# Patient Record
Sex: Female | Born: 1995 | Race: Black or African American | Hispanic: No | Marital: Single | Smoking: Never smoker
Health system: Southern US, Community
[De-identification: ages and names within clinical notes are randomized; demographics above are authoritative.]

## PROBLEM LIST (undated history)

## (undated) DIAGNOSIS — E119 Type 2 diabetes mellitus without complications: Secondary | ICD-10-CM

---

## 2021-01-21 ENCOUNTER — Other Ambulatory Visit: Payer: Self-pay

## 2021-01-21 ENCOUNTER — Encounter (HOSPITAL_COMMUNITY): Payer: Self-pay

## 2021-01-21 ENCOUNTER — Emergency Department (HOSPITAL_COMMUNITY): Payer: BC Managed Care – PPO

## 2021-01-21 ENCOUNTER — Emergency Department (HOSPITAL_COMMUNITY)
Admission: EM | Admit: 2021-01-21 | Discharge: 2021-01-21 | Disposition: A | Discharge status: Home / Self Care | Payer: BC Managed Care – PPO | Attending: Emergency Medicine | Admitting: Emergency Medicine

## 2021-01-21 DIAGNOSIS — R000 Tachycardia, unspecified: Secondary | ICD-10-CM | POA: Diagnosis not present

## 2021-01-21 DIAGNOSIS — R739 Hyperglycemia, unspecified: Secondary | ICD-10-CM

## 2021-01-21 DIAGNOSIS — E1165 Type 2 diabetes mellitus with hyperglycemia: Secondary | ICD-10-CM | POA: Insufficient documentation

## 2021-01-21 DIAGNOSIS — Z3A01 Less than 8 weeks gestation of pregnancy: Secondary | ICD-10-CM | POA: Insufficient documentation

## 2021-01-21 DIAGNOSIS — B9689 Other specified bacterial agents as the cause of diseases classified elsewhere: Secondary | ICD-10-CM | POA: Diagnosis not present

## 2021-01-21 DIAGNOSIS — O26891 Other specified pregnancy related conditions, first trimester: Secondary | ICD-10-CM | POA: Diagnosis present

## 2021-01-21 DIAGNOSIS — O23591 Infection of other part of genital tract in pregnancy, first trimester: Secondary | ICD-10-CM | POA: Insufficient documentation

## 2021-01-21 DIAGNOSIS — R109 Unspecified abdominal pain: Secondary | ICD-10-CM

## 2021-01-21 HISTORY — DX: Type 2 diabetes mellitus without complications: E11.9

## 2021-01-21 LAB — CBC WITH DIFFERENTIAL/PLATELET
Abs Immature Granulocytes: 0.02 10*3/uL (ref 0.00–0.07)
Basophils Absolute: 0 10*3/uL (ref 0.0–0.1)
Basophils Relative: 0 %
Eosinophils Absolute: 0 10*3/uL (ref 0.0–0.5)
Eosinophils Relative: 1 %
HCT: 41 % (ref 36.0–46.0)
Hemoglobin: 13 g/dL (ref 12.0–15.0)
Immature Granulocytes: 0 %
Lymphocytes Relative: 28 %
Lymphs Abs: 2.2 10*3/uL (ref 0.7–4.0)
MCH: 22 pg — ABNORMAL LOW (ref 26.0–34.0)
MCHC: 31.7 g/dL (ref 30.0–36.0)
MCV: 69.4 fL — ABNORMAL LOW (ref 80.0–100.0)
Monocytes Absolute: 0.7 10*3/uL (ref 0.1–1.0)
Monocytes Relative: 9 %
Neutro Abs: 5 10*3/uL (ref 1.7–7.7)
Neutrophils Relative %: 62 %
Platelets: 356 10*3/uL (ref 150–400)
RBC: 5.91 MIL/uL — ABNORMAL HIGH (ref 3.87–5.11)
RDW: 17.6 % — ABNORMAL HIGH (ref 11.5–15.5)
WBC: 8 10*3/uL (ref 4.0–10.5)
nRBC: 0 % (ref 0.0–0.2)

## 2021-01-21 LAB — URINALYSIS, ROUTINE W REFLEX MICROSCOPIC
Bilirubin Urine: NEGATIVE
Glucose, UA: >=500 mg/dL — AB
Hgb urine dipstick: NEGATIVE
Ketones, ur: 80 mg/dL — AB
Leukocytes,Ua: NEGATIVE
Nitrite: NEGATIVE
Protein, ur: NEGATIVE mg/dL
Specific Gravity, Urine: 1.044 — ABNORMAL HIGH (ref 1.005–1.030)
pH: 6 (ref 5.0–8.0)

## 2021-01-21 LAB — COMPREHENSIVE METABOLIC PANEL
ALT: 10 U/L (ref 0–44)
AST: 19 U/L (ref 15–41)
Albumin: 4.1 g/dL (ref 3.5–5.0)
Alkaline Phosphatase: 53 U/L (ref 38–126)
Anion gap: 7 (ref 5–15)
BUN: 7 mg/dL (ref 6–20)
CO2: 23 mmol/L (ref 22–32)
Calcium: 9.3 mg/dL (ref 8.9–10.3)
Chloride: 104 mmol/L (ref 98–111)
Creatinine, Ser: 0.53 mg/dL (ref 0.44–1.00)
GFR, Estimated: >60 mL/min (ref >60)
Glucose, Bld: 290 mg/dL — ABNORMAL HIGH (ref 70–99)
Potassium: 3.7 mmol/L (ref 3.5–5.1)
Sodium: 134 mmol/L — ABNORMAL LOW (ref 135–145)
Total Bilirubin: 0.4 mg/dL (ref 0.3–1.2)
Total Protein: 8.3 g/dL — ABNORMAL HIGH (ref 6.5–8.1)

## 2021-01-21 LAB — ABO/RH: ABO/RH(D): AB POS

## 2021-01-21 LAB — TYPE AND SCREEN
ABO/RH(D): AB POS
Antibody Screen: NEGATIVE

## 2021-01-21 LAB — WET PREP, GENITAL
Sperm: NONE SEEN
Trich, Wet Prep: NONE SEEN
Yeast Wet Prep HPF POC: NONE SEEN

## 2021-01-21 LAB — RH IG WORKUP (INCLUDES ABO/RH): Gestational Age(Wks): 5

## 2021-01-21 LAB — I-STAT BETA HCG BLOOD, ED (MC, WL, AP ONLY): I-stat hCG, quantitative: 1198.4 m[IU]/mL — ABNORMAL HIGH (ref <5)

## 2021-01-21 LAB — HCG, QUANTITATIVE, PREGNANCY: hCG, Beta Chain, Quant, S: 1321 m[IU]/mL — ABNORMAL HIGH (ref <5)

## 2021-01-21 MED ORDER — CLINDAMYCIN PHOSPHATE 100 MG VA SUPP: 100.0000 mg | Freq: Every day | VAGINAL | 0 refills | Status: AC

## 2021-01-21 MED ORDER — SODIUM CHLORIDE 0.9 % IV BOLUS
1000.0000 mL | Freq: Once | INTRAVENOUS | Status: AC
Start: 2021-01-21 — End: 2021-01-21
  Administered 2021-01-21: 1000 mL via INTRAVENOUS

## 2021-01-21 MED ORDER — ONDANSETRON HCL 4 MG/2ML IJ SOLN
4.0000 mg | Freq: Once | INTRAMUSCULAR | Status: AC
  Administered 2021-01-21: 4 mg via INTRAVENOUS
  Filled 2021-01-21: qty 2

## 2021-01-21 MED ORDER — SODIUM CHLORIDE 0.9 % IV BOLUS
1000.0000 mL | Freq: Once | INTRAVENOUS | Status: AC
  Administered 2021-01-21: 1000 mL via INTRAVENOUS

## 2021-01-21 MED ORDER — MORPHINE SULFATE (PF) 4 MG/ML IV SOLN
4.0000 mg | Freq: Once | INTRAVENOUS | Status: AC
  Administered 2021-01-21: 4 mg via INTRAVENOUS
  Filled 2021-01-21: qty 1

## 2021-01-21 NOTE — ED Notes (Signed)
Pt verbalized understanding of d/c, medication, and follow up care. Ambulatory with steady gait.  

## 2021-01-21 NOTE — ED Provider Notes (Signed)
[facility]-EMERGENCY DEPT Provider Note   CSN: [id number] Arrival date & time: 01/21/21  1818     History Chief Complaint  Patient presents with  . Abdominal Pain    Samantha Wade is a 25 y.o. female with history significant for type 2 diabetes who presents for evaluation abdominal pain.  Began yesterday.  Pain intermittent in nature. Currently located at RLQ however will go to multiple spots in the abdomen.  Pain is not worse with movement.  Can happen spontaneously.  Can last for minutes to hours.  Has noted a large amount of dark pink vaginal discharge.  LMP 12/15/2020.  She is not currently followed by OB/GYN however does have an appointment scheduled to see [facility] obgyn on 02/01/21.  She has not had ultrasound establish IUP.  This is her first pregnancy.  She rates her pain a 7/10.  No fever, chills, chest pain, shortness of breath, dysuria, hematuria.  Denies additional aggravating or alleviating factors.  No prior abdominal surgeries.  History obtained from patient and past medical records.  No interpreter used  HPI     Past Medical History:  Diagnosis Date  . Diabetes mellitus without complication (HCC)     There are no problems to display for this patient.   History reviewed. No pertinent surgical history.   OB History    Gravida  1   Para      Term      Preterm      AB      Living        SAB      IAB      Ectopic      Multiple      Live Births              No family history on file.  Social History   Tobacco Use  . Smoking status: Never Smoker  . Smokeless tobacco: Never Used  Substance Use Topics  . Alcohol use: Not Currently  . Drug use: Not Currently    Home Medications Prior to Admission medications   Medication Sig Start Date End Date Taking? Authorizing Provider  clindamycin (CLEOCIN) 100 MG vaginal suppository Place 1 suppository (100 mg total) vaginally at bedtime for 7 days. 01/21/21 01/28/21 Yes  Chidi Shirer A, PA-C  Prenatal Vit-Fe Fumarate-FA (MULTIVITAMIN-PRENATAL) 27-0.8 MG TABS tablet Take 1 tablet by mouth daily at 12 noon.   Yes [provider]    Allergies    Patient has no known allergies.  Review of Systems   Review of Systems  Constitutional: Negative.   HENT: Negative.   Respiratory: Negative.   Cardiovascular: Negative.   Gastrointestinal: Positive for abdominal pain and nausea. Negative for diarrhea and vomiting.  Genitourinary: Positive for vaginal discharge.  Musculoskeletal: Negative.   Skin: Negative.   Neurological: Negative.   All other systems reviewed and are negative.   Physical Exam Updated Vital Signs BP 132/72   Pulse (!) 103   Temp 98.8 F (37.1 C) (Oral) Comment: Simultaneous filing. User may not have seen previous data. Comment (Src): Simultaneous filing. User may not have seen previous data.  Resp 16   Ht 5\' 2"  (1.575 m)   Wt 95.3 kg   LMP 12/15/2020   SpO2 100%   BMI 38.41 kg/m   Physical Exam Vitals and nursing note reviewed.  Constitutional:      General: She is not in acute distress.    Appearance: She is well-developed. She is  not ill-appearing, toxic-appearing or diaphoretic.  HENT:     Head: Normocephalic and atraumatic.     Mouth/Throat:     Mouth: Mucous membranes are moist.  Eyes:     Pupils: Pupils are equal, round, and reactive to light.  Cardiovascular:     Rate and Rhythm: Tachycardia present.     Heart sounds: Normal heart sounds.  Pulmonary:     Effort: Pulmonary effort is normal. No respiratory distress.     Breath sounds: Normal breath sounds.  Abdominal:     General: Bowel sounds are normal. There is no distension.     Palpations: Abdomen is soft.     Tenderness: There is abdominal tenderness in the right lower quadrant. There is no guarding or rebound.     Hernia: No hernia is present.     Comments: Tenderness to right lower quadrant.  Genitourinary:    Comments: Normal appearing  external female genitalia without rashes or lesions, normal vaginal epithelium. Normal appearing closed cervix with large amount thick white discharge. No cervical petechiae. Cervical os is closed. There is no bleeding noted at the os. No odor. Bimanual: No CMT, nontender.  No palpable adnexal masses or tenderness. Uterus midline and not fixed. Rectovaginal exam was deferred.  No cystocele or rectocele noted. No pelvic lymphadenopathy noted. Wet prep was obtained.  Cultures for gonorrhea and chlamydia collected. Exam performed with chaperone in room. Musculoskeletal:        General: Normal range of motion.     Cervical back: Normal range of motion.  Skin:    General: Skin is warm and dry.     Capillary Refill: Capillary refill takes less than 2 seconds.  Neurological:     General: No focal deficit present.     Mental Status: She is alert.     ED Results / Procedures / Treatments   Labs (all labs ordered are listed, but only abnormal results are displayed) Labs Reviewed  WET PREP, GENITAL - Abnormal; Notable for the following components:      Result Value   Clue Cells Wet Prep HPF POC PRESENT (*)    WBC, Wet Prep HPF POC FEW (*)    All other components within normal limits  CBC WITH DIFFERENTIAL/PLATELET - Abnormal; Notable for the following components:   RBC 5.91 (*)    MCV 69.4 (*)    MCH 22.0 (*)    RDW 17.6 (*)    All other components within normal limits  COMPREHENSIVE METABOLIC PANEL - Abnormal; Notable for the following components:   Sodium 134 (*)    Glucose, Bld 290 (*)    Total Protein 8.3 (*)    All other components within normal limits  URINALYSIS, ROUTINE W REFLEX MICROSCOPIC - Abnormal; Notable for the following components:   APPearance CLOUDY (*)    Specific Gravity, Urine 1.044 (*)    Glucose, UA >=500 (*)    Ketones, ur 80 (*)    Bacteria, UA RARE (*)    All other components within normal limits  HCG, QUANTITATIVE, PREGNANCY - Abnormal; Notable for the  following components:   hCG, Beta Chain, Quant, S 1,321 (*)    All other components within normal limits  I-STAT BETA HCG BLOOD, ED ([facility], [facility], [facility] ONLY) - Abnormal; Notable for the following components:   I-stat hCG, quantitative 1,198.4 (*)    All other components within normal limits  RPR  CBG MONITORING, ED  RH IG WORKUP (INCLUDES ABO/RH)  TYPE AND SCREEN  ABO/RH  GC/CHLAMYDIA PROBE AMP ([place]    EKG EKG Interpretation  Date/Time:  Thursday Jan 21 2021 20:04:48 EDT Ventricular Rate:  104 PR Interval:    QRS Duration: 96 QT Interval:  339 QTC Calculation: 446 R Axis:   -18 Text Interpretation: Probable left ventricular hypertrophy sinus tachycardia, no previous for comparison Confirmed by Frederick PeersLittle, Rachel [id number]) on 01/21/2021 8:53:40 PM   Radiology US OB Comp < 14 Wks  Result Date: 01/21/2021 CLINICAL DATA:  Pregnancy bleeding pain EXAM: OBSTETRIC <14 WK US AND TRANSVAGINAL OB US TECHNIQUE: Both transabdominal and transvaginal ultrasound examinations were performed for complete evaluation of the gestation as well as the maternal uterus, adnexal regions, and pelvic cul-de-sac. Transvaginal technique was performed to assess early pregnancy. COMPARISON:  None. FINDINGS: Intrauterine gestational sac: Possible tiny intrauterine gestational sac Yolk sac:  Not seen Embryo:  Not seen MSD: 2.8 mm   5 w   0 d Maternal uterus/adnexae: Ovaries are within normal limits. The left ovary measures 3.7 x 2.8 x 2.3 cm and contains corpus luteum. The right ovary measures 2.4 x 1.6 x 2.5 cm. Trace free fluid. IMPRESSION: 1. Possible early intrauterine gestational sac, but no yolk sac, fetal pole, or cardiac activity yet visualized. Recommend follow-up quantitative B-HCG levels and follow-up US in 14 days to confirm and assess viability. This recommendation follows SRU consensus guidelines: Diagnostic Criteria for Nonviable Pregnancy Early in the First Trimester. Malva Limes Engl J Med 2013;  604:5409-81;  369:1443-51. 2. Trace free fluid Electronically Signed   By: Jasmine PangKim  Fujinaga M.D.   On: 01/21/2021 20:40   US OB Transvaginal  Result Date: 01/21/2021 CLINICAL DATA:  Pregnancy bleeding pain EXAM: OBSTETRIC <14 WK US AND TRANSVAGINAL OB US TECHNIQUE: Both transabdominal and transvaginal ultrasound examinations were performed for complete evaluation of the gestation as well as the maternal uterus, adnexal regions, and pelvic cul-de-sac. Transvaginal technique was performed to assess early pregnancy. COMPARISON:  None. FINDINGS: Intrauterine gestational sac: Possible tiny intrauterine gestational sac Yolk sac:  Not seen Embryo:  Not seen MSD: 2.8 mm   5 w   0 d Maternal uterus/adnexae: Ovaries are within normal limits. The left ovary measures 3.7 x 2.8 x 2.3 cm and contains corpus luteum. The right ovary measures 2.4 x 1.6 x 2.5 cm. Trace free fluid. IMPRESSION: 1. Possible early intrauterine gestational sac, but no yolk sac, fetal pole, or cardiac activity yet visualized. Recommend follow-up quantitative B-HCG levels and follow-up US in 14 days to confirm and assess viability. This recommendation follows SRU consensus guidelines: Diagnostic Criteria for Nonviable Pregnancy Early in the First Trimester. Malva Limes Engl J Med 2013; 191:4782-95; 369:1443-51. 2. Trace free fluid Electronically Signed   By: Jasmine PangKim  Fujinaga M.D.   On: 01/21/2021 20:40    Procedures Procedures   Medications Ordered in ED Medications  sodium chloride 0.9 % bolus 1,000 mL (1,000 mLs Intravenous Bolus from Bag 01/21/21 1936)  morphine 4 MG/ML injection 4 mg (4 mg Intravenous Given 01/21/21 1928)  ondansetron (ZOFRAN) injection 4 mg (4 mg Intravenous Given 01/21/21 1928)  sodium chloride 0.9 % bolus 1,000 mL (0 mLs Intravenous Stopped 01/21/21 2106)    ED Course  I have reviewed the triage vital signs and the nursing notes.  Pertinent labs & imaging results that were available during my care of the patient were reviewed by me and considered in my  medical decision making (see chart for details).  G1, P0 approximately [redacted] weeks pregnant based off LMP presents for evaluation of  abd pain.  Began yesterday, intermittent in nature. Associated dark pink vaginal discharge.  Has not had ultrasound to establish IUP.  She is afebrile, nonseptic appearing.  She does appear uncomfortable in room.  No urinary complaints.  Patient is tachycardic however not hypotensive.  Plan on labs, imaging and reassess.  Labs and imaging personally reviewed and interpreted:  I-STAT preg at 1198 UA negative for infection, does have glucosuria, ketonuria Metabolic panel with sodium 134, glucose 290, normal CO2, no elevated anion gap.  Low suspicion for DKA Wet prep with many clue cells consistent with BV Blood type Rh+ Ultrasound with possible IUP however no yolk sac, heartbeat  Patient reassessed.  Tachycardia improved with IV fluids.  No current pain.  Reevaluation abdomen soft, nontender.  Discussed ultrasound and labs.  Will treat for BV.  She is already on prenatal vitamin.  Discussed need for close outpatient follow-up with OB.  She will call them tomorrow.  On repeat exam patient does not have a surgical abdomin and there are no peritoneal signs.  No indication of appendicitis, bowel obstruction, bowel perforation, cholecystitis, diverticulitis, PID, TOA, torsion or ectopic pregnancy.    The patient has been appropriately medically screened and/or stabilized in the ED. I have low suspicion for any other emergent medical condition which would require further screening, evaluation or treatment in the ED or require inpatient management.  Patient is hemodynamically stable and in no acute distress.  Patient able to ambulate in department prior to ED.  Evaluation does not show acute pathology that would require ongoing or additional emergent interventions while in the emergency department or further inpatient treatment.  I have discussed the diagnosis with the patient  and answered all questions.  Pain is been managed while in the emergency department and patient has no further complaints prior to discharge.  Patient is comfortable with plan discussed in room and is stable for discharge at this time.  I have discussed strict return precautions for returning to the emergency department.  Patient was encouraged to follow-up with PCP/specialist refer to at discharge.      MDM Rules/Calculators/A&P                           Final Clinical Impression(s) / ED Diagnoses Final diagnoses:  Bacterial vaginosis in pregnancy  Hyperglycemia  Abdominal pain during pregnancy in first trimester    Rx / DC Orders ED Discharge Orders         Ordered    clindamycin (CLEOCIN) 100 MG vaginal suppository  Daily at bedtime        01/21/21 2141           Xan Ingraham A, PA-C 01/21/21 2146    Little, Ambrose Finland, MD 01/28/21 1338

## 2021-01-21 NOTE — Discharge Instructions (Signed)
Call your OB/GYN tomorrow and let them know you are seen here in the emergency department.  Your vaginal swab did show bacterial vaginosis which is an overgrowth of the normal bacteria that lives in the vagina.  I have written you for a vaginal suppository to use nightly over the next 7 nights.  Return for new or worsening symptoms

## 2021-01-21 NOTE — ED Triage Notes (Signed)
Pt c/o lower abd pain/cramping that radiates to back with "large amounts of pinkish discharge" since yesterday. Pt states she is [redacted] weeks pregnant.

## 2021-01-22 LAB — GC/CHLAMYDIA PROBE AMP (~~LOC~~) NOT AT ARMC
Chlamydia: NEGATIVE
Comment: NEGATIVE
Comment: NORMAL
Neisseria Gonorrhea: NEGATIVE

## 2021-01-22 LAB — RPR
RPR Ser Ql: REACTIVE — AB
RPR Titer: 1:16 {titer}

## 2021-01-25 LAB — T.PALLIDUM AB, TOTAL: T Pallidum Abs: REACTIVE — AB

## 2021-02-10 ENCOUNTER — Other Ambulatory Visit: Payer: Self-pay

## 2021-02-10 ENCOUNTER — Ambulatory Visit (INDEPENDENT_AMBULATORY_CARE_PROVIDER_SITE_OTHER): Payer: Medicaid Other | Admitting: Clinical

## 2021-02-10 DIAGNOSIS — F603 Borderline personality disorder: Secondary | ICD-10-CM

## 2021-02-10 DIAGNOSIS — F39 Unspecified mood [affective] disorder: Secondary | ICD-10-CM | POA: Diagnosis not present
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
## 2021-02-22 ENCOUNTER — Other Ambulatory Visit: Payer: Self-pay

## 2021-02-22 ENCOUNTER — Ambulatory Visit (INDEPENDENT_AMBULATORY_CARE_PROVIDER_SITE_OTHER): Payer: BC Managed Care – PPO | Admitting: Clinical

## 2021-02-22 DIAGNOSIS — F39 Unspecified mood [affective] disorder: Secondary | ICD-10-CM | POA: Diagnosis not present

## 2021-02-22 DIAGNOSIS — F603 Borderline personality disorder: Secondary | ICD-10-CM | POA: Diagnosis not present

## 2021-02-22 NOTE — Progress Notes (Signed)
   THERAPIST PROGRESS NOTE  Session Time: 1pm  Participation Level: Active  Behavioral Response: CasualAlertAnxious  Type of Therapy: Individual Therapy  Treatment Goals addressed: Coping  Interventions: DBT  Summary: Samantha Wade is a 25 y.o. female who presents in anxious manner. Pt reports recently having a miscarriage.Pt declined participation in MHIOP at this time due to experiencing social anxiety. Pt reports avoiding social settings and avoids making eye contact with others in public settings. Pt reports an ongoing hx of unhealthy relationships and challenges with making and maintaining relationships with others. Pt says she only interacts with her mother but communicates with people on social media. Pt report when they do something to "disrespect her" she will block them or stop communicating with them for a period of time. Pt acknowledges this has made a lonely life and states "I want to be around people, I dont want to die alone like my daddy" Pt endorses being very short tempered, hypervigilant, aggressive. Pt showed Clinical research associate where she has done property damage to her home when she has gotten upset, displaying several holes in the walls. Additionally, pt says she will cut self if she is not able to physically harm the person who has made her upset. Pt discussed having similar challenges in college with maintaining relationships. At that time, pt says she was promiscuous, engaging in substance use and having stressful relationships with her roommates. In school, pt says due to so many conflicts/complaints she was assigned single room each year. Presently, pt states having conflict with her roommate whom she lives with.  Suicidal/Homicidal: Pt denies any current SI/HI, has hx of self harming behavior and physical aggression when she feels she is being "disrespected" Pt reports she was hospitalized 3x in college for SI, no attempts made. Pt reports she would text her friends and state "Im  going to kill myself" and would campus police would arrive would minimize or deny SI.  Therapist Response: CSW assessed for changes in mood and behavior. CSW introduced emotion regulation skills to assist pt with learning to manage feelings and better cope with stressful situation.CSW discussed with pt practicing opposite action to change emotion. CSW processed with pt cognitive distortions and pt tendency to jump to conclusions when she suspects she is being disrespected or upset.  Plan: Return again in 2 weeks.  Diagnosis: Axis I: Unspecified mood disorder    Axis II: Borderline Personality Dis.    Suzan Slick, LCSW 02/22/2021

## 2021-03-11 ENCOUNTER — Other Ambulatory Visit: Payer: Self-pay

## 2021-03-11 ENCOUNTER — Ambulatory Visit (INDEPENDENT_AMBULATORY_CARE_PROVIDER_SITE_OTHER): Payer: BC Managed Care – PPO | Admitting: Clinical

## 2021-03-11 DIAGNOSIS — F603 Borderline personality disorder: Secondary | ICD-10-CM | POA: Diagnosis not present

## 2021-03-11 DIAGNOSIS — F39 Unspecified mood [affective] disorder: Secondary | ICD-10-CM | POA: Diagnosis not present

## 2021-03-11 NOTE — Progress Notes (Signed)
   THERAPIST PROGRESS NOTE  Session Time: 1pm  Participation Level: Active  Behavioral Response: CasualAlertpleasant  Type of Therapy: Individual Therapy  Treatment Goals addressed: Coping  Interventions: Supportive Virtual Visit via Video Note  I connected with Samantha Wade on 03/11/21 at 11:00 AM EDT by a video enabled telemedicine application and verified that I am speaking with the correct person using two identifiers.  Location: Patient: home Provider: office   I discussed the limitations of evaluation and management by telemedicine and the availability of in person appointments. The patient expressed understanding and agreed to proceed.   I discussed the assessment and treatment plan with the patient. The patient was provided an opportunity to ask questions and all were answered. The patient agreed with the plan and demonstrated an understanding of the instructions.   The patient was advised to call back or seek an in-person evaluation if the symptoms worsen or if the condition fails to improve as anticipated.  I provided 35 minutes of non-face-to-face time during this encounter.   Summary: Samantha Wade is a 25 y.o. female who reports no change in mood or behavior. Pt states work is going well, she has started speaking to her roommate and she found a puppy. Pt was distracted by puppy and required redirection. Pt says she practiced dbt skill(opposite action) to avoid getting upset with others. Pt reports she has enjoyed being outside in nature, meditating and listening to music as a part of her coping methods. Pt reports she has started to date again and discussed recently going on several dates with various men. Additionally, pt reports when she is interacting with males she finds herself engaging in marijuana and alcohol use. Per pt report on one of her dates she drank "10 shots of hennessy" According to pt, she engaged in sexual intercourse with one of the dates but  says she has no desire to be in a relationship with anyone. Pt acknowledges her impulsive behavior and states "Im more nonchalant, so I cant get my feelings hurt" Pt presents with difficulty understanding potential safety risks of impulsive decision making.  Suicidal/Homicidal: Pt denies SI/HI no plan or intent to harm self or others reported.  Therapist Response: CSW discussed with pt importance of implementing and maintaining personal boundaries. CSW processed with pt potential consequences of unhealthy relationships and impulsive decision making. CSW explained and identified safety concerns in relationships she is establishing with males.  Plan: Return again in 2 weeks.  Diagnosis: Axis I: Unspecified mood disorder    Axis II: Borderline Personality Dis.    Suzan Slick, LCSW 03/11/2021

## 2021-07-11 ENCOUNTER — Emergency Department (HOSPITAL_COMMUNITY)
Admission: EM | Admit: 2021-07-11 | Discharge: 2021-07-11 | Discharge status: Against Medical Advice | Payer: BC Managed Care – PPO | Source: Home / Self Care

## 2021-08-02 ENCOUNTER — Emergency Department (HOSPITAL_COMMUNITY)
Admission: EM | Admit: 2021-08-02 | Discharge: 2021-08-02 | Discharge status: Against Medical Advice | Payer: BC Managed Care – PPO | Attending: Emergency Medicine | Admitting: Emergency Medicine

## 2021-08-02 ENCOUNTER — Encounter (HOSPITAL_COMMUNITY): Payer: Self-pay

## 2021-08-02 DIAGNOSIS — N898 Other specified noninflammatory disorders of vagina: Secondary | ICD-10-CM | POA: Insufficient documentation

## 2021-08-02 DIAGNOSIS — L292 Pruritus vulvae: Secondary | ICD-10-CM | POA: Diagnosis present

## 2021-08-02 DIAGNOSIS — Z5321 Procedure and treatment not carried out due to patient leaving prior to being seen by health care provider: Secondary | ICD-10-CM | POA: Diagnosis not present

## 2021-08-02 NOTE — ED Notes (Signed)
Pt still not present in room.  MD notified and aware.  ED RN to dc pt's chart as pt has not been seen for 15 minutes.

## 2021-08-02 NOTE — ED Notes (Signed)
Pt not present in room. Pt eloped

## 2021-08-02 NOTE — ED Notes (Signed)
Pt not present in room at this time.

## 2021-08-02 NOTE — ED Triage Notes (Signed)
Pt presents with c/o vaginal itching and burning that started on Sunday. Pt also reports discharge.

## 2022-08-21 IMAGING — US US OB COMP LESS 14 WK
1 series · 15 of 28 positions shown · non-contrast
Comparison: None.

CLINICAL DATA: Pregnancy bleeding pain

EXAM:
OBSTETRIC <14 WK US AND TRANSVAGINAL OB US
TECHNIQUE: Both transabdominal and transvaginal ultrasound examinations were
performed for complete evaluation of the gestation as well as the
maternal uterus, adnexal regions, and pelvic cul-de-sac.
Transvaginal technique was performed to assess early pregnancy.

[Series 1: us ob comp less 14 wks mc & wl · 15 of 66 slices shown]
[im 1/66]
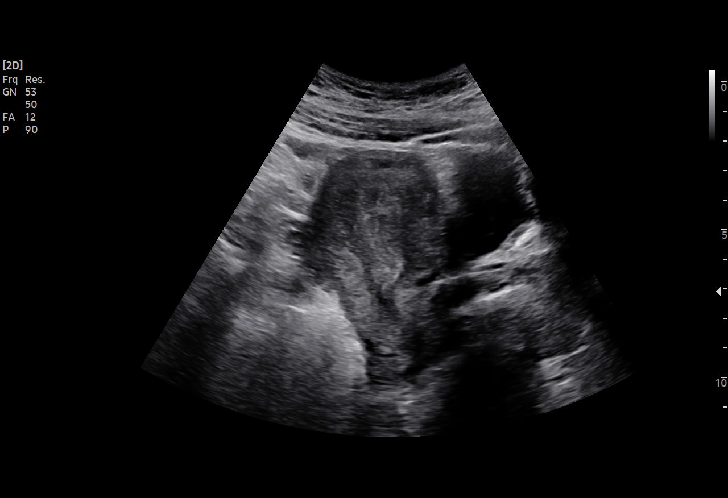
[im 5/66]
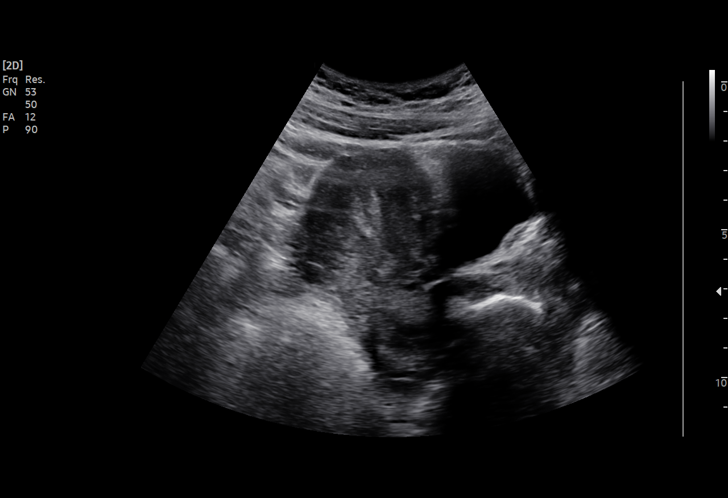
[im 10/66]
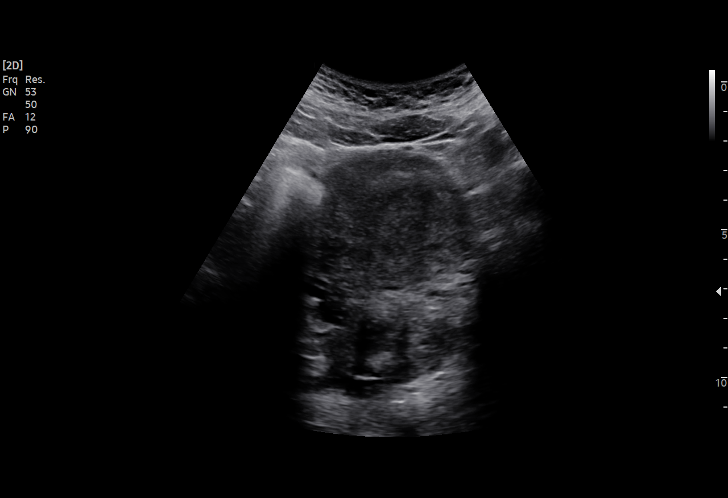
[im 15/66]
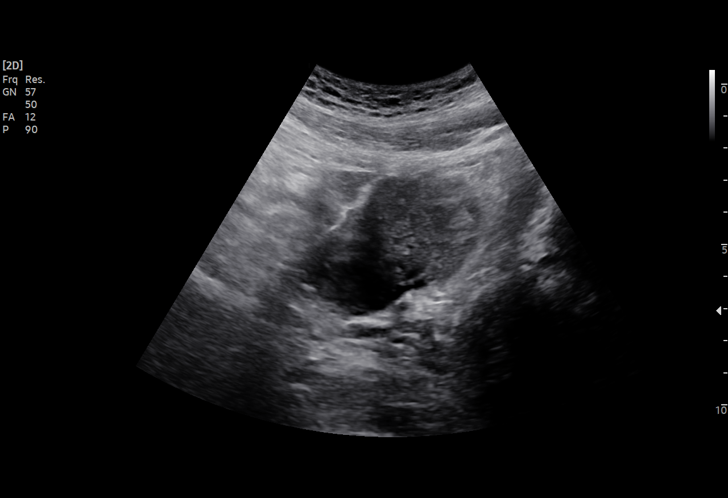
[im 20/66]
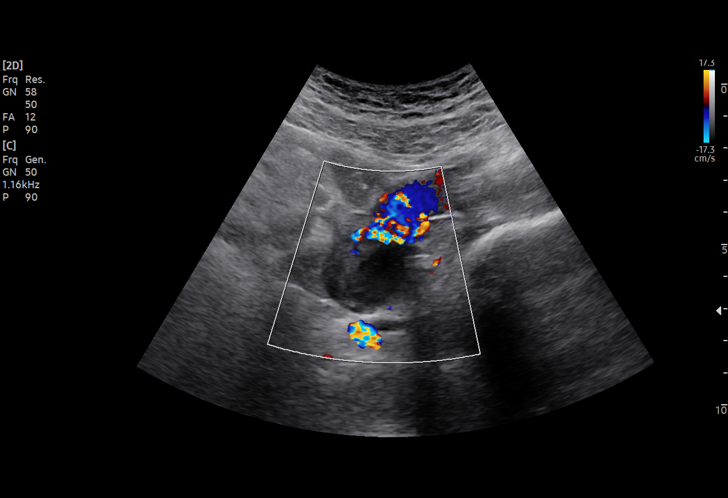
[im 25/66]
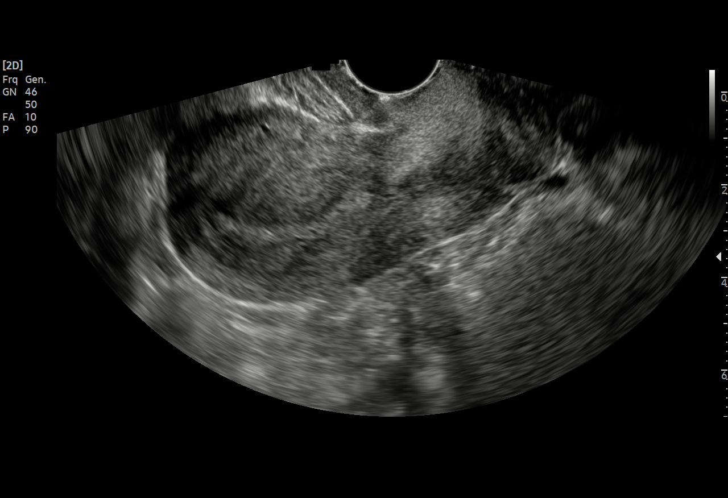
[im 29/66]
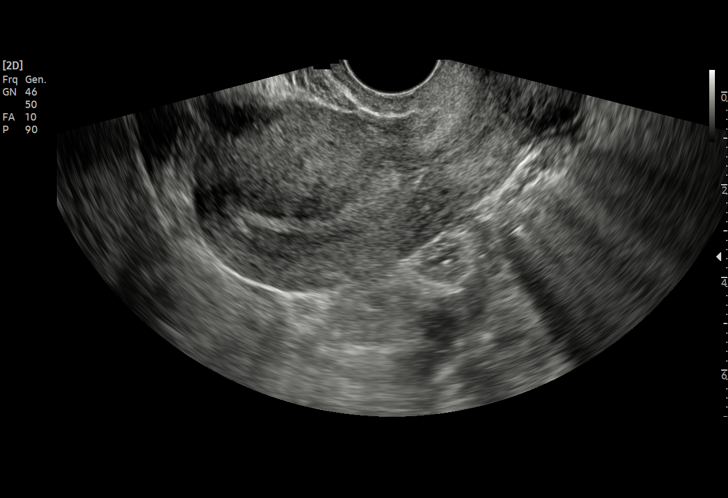
[im 34/66]
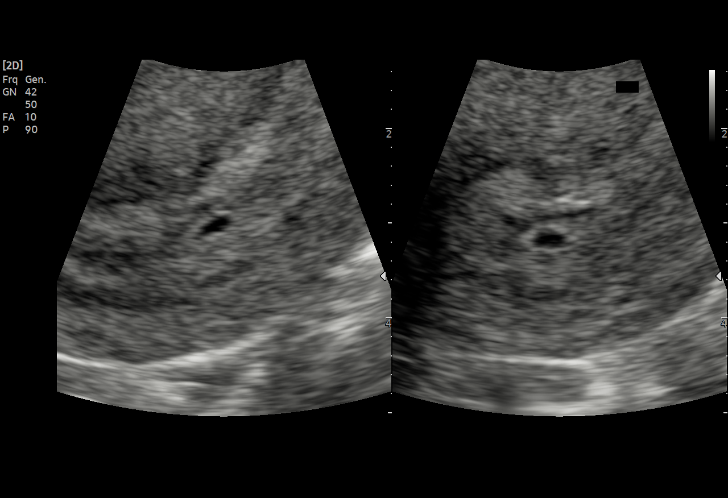
[im 37/66]
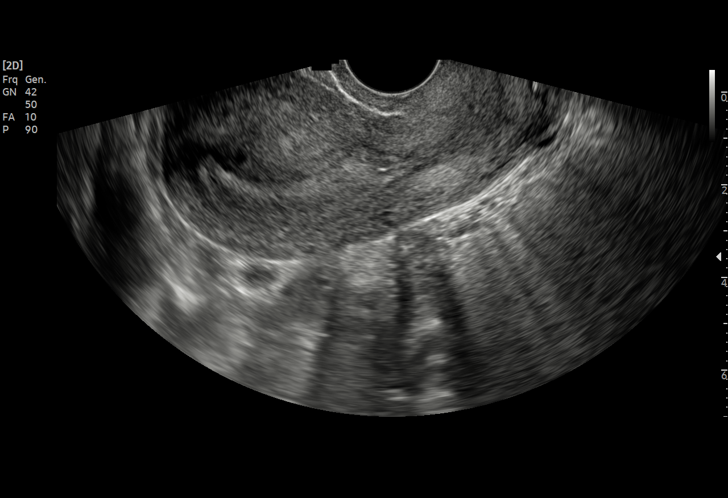
[im 41/66]
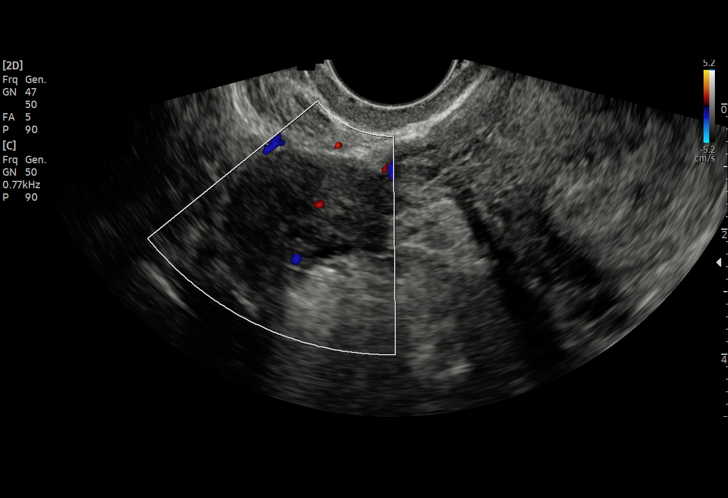
[im 46/66]
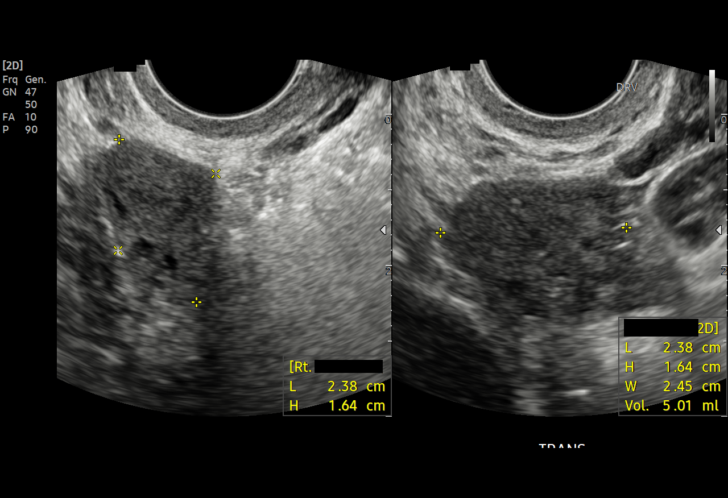
[im 51/66]
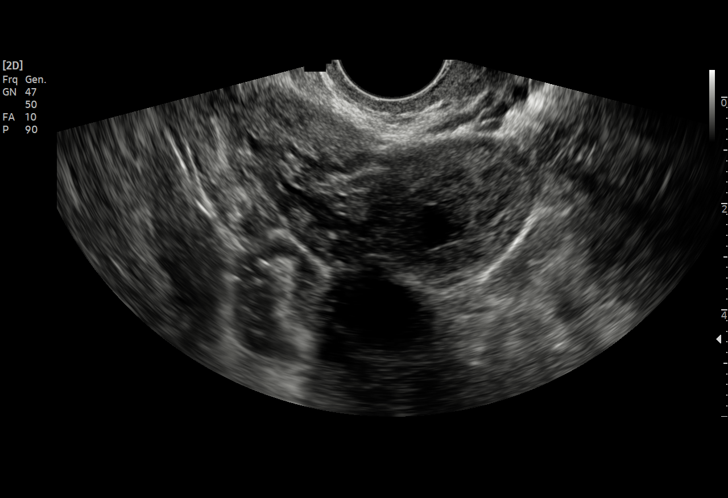
[im 56/66]
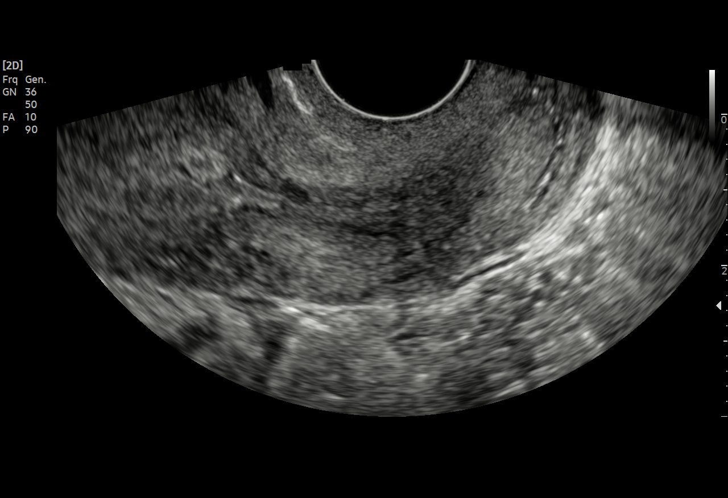
[im 61/66]
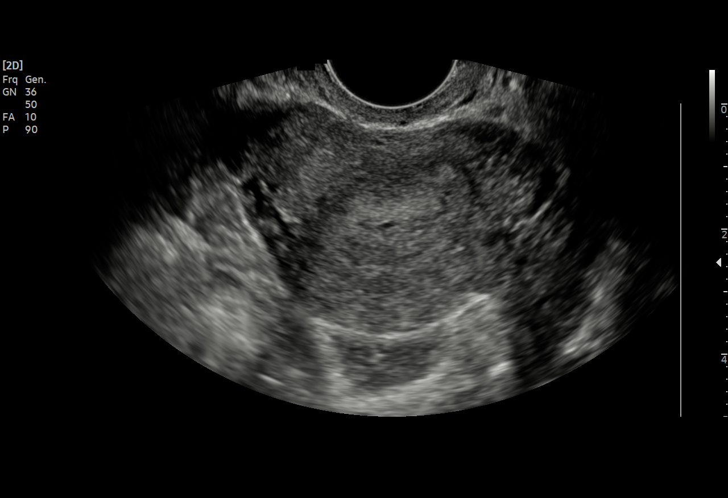
[im 66/66]
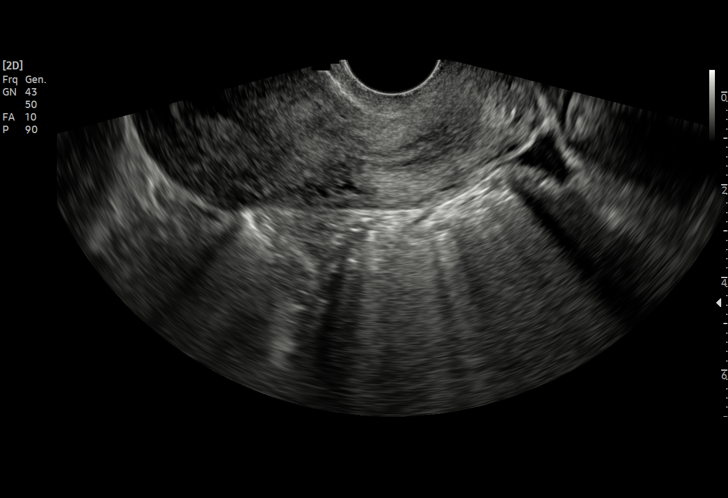

[15 of 28 positions shown; findings below may reference images not displayed]

FINDINGS: Intrauterine gestational sac: Possible tiny intrauterine gestational
sac

Yolk sac:  Not seen

Embryo:  Not seen

MSD: 2.8 mm   5 w   0 d

Maternal uterus/adnexae: Ovaries are within normal limits. The left
ovary measures 3.7 x 2.8 x 2.3 cm and contains corpus luteum. The
right ovary measures 2.4 x 1.6 x 2.5 cm. Trace free fluid.
IMPRESSION: 1. Possible early intrauterine gestational sac, but no yolk sac,
fetal pole, or cardiac activity yet visualized. Recommend follow-up
quantitative B-HCG levels and follow-up US in 14 days to confirm and
assess viability. This recommendation follows SRU consensus
guidelines: Diagnostic Criteria for Nonviable Pregnancy Early in the
First Trimester. N Engl J Med 9006; [DATE].
2. Trace free fluid
